# Patient Record
Sex: Female | Born: 1945 | Race: White | Hispanic: No | Marital: Married
Health system: Southern US, Community
[De-identification: ages and names within clinical notes are randomized; demographics above are authoritative.]

## PROBLEM LIST (undated history)

## (undated) DIAGNOSIS — D229 Melanocytic nevi, unspecified: Secondary | ICD-10-CM

---
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
## 1997-10-11 ENCOUNTER — Inpatient Hospital Stay (HOSPITAL_COMMUNITY): Admission: AD | Admit: 1997-10-11 | Discharge: 1997-10-14 | Payer: Self-pay | Admitting: Gastroenterology

## 1998-02-12 ENCOUNTER — Inpatient Hospital Stay (HOSPITAL_COMMUNITY): Admission: EM | Admit: 1998-02-12 | Discharge: 1998-02-15 | Payer: Self-pay | Admitting: Gastroenterology

## 1998-09-03 ENCOUNTER — Encounter (HOSPITAL_COMMUNITY): Admission: RE | Admit: 1998-09-03 | Discharge: 1998-12-02 | Payer: Self-pay | Admitting: Gastroenterology

## 1998-10-17 ENCOUNTER — Encounter: Admission: RE | Admit: 1998-10-17 | Discharge: 1999-01-15 | Payer: Self-pay | Admitting: Emergency Medicine

## 1998-10-18 ENCOUNTER — Ambulatory Visit (HOSPITAL_COMMUNITY): Admission: RE | Admit: 1998-10-18 | Discharge: 1998-10-18 | Payer: Self-pay | Admitting: Gastroenterology

## 1999-02-20 ENCOUNTER — Inpatient Hospital Stay (HOSPITAL_COMMUNITY): Admission: EM | Admit: 1999-02-20 | Discharge: 1999-02-23 | Payer: Self-pay | Admitting: Emergency Medicine

## 1999-02-20 ENCOUNTER — Encounter: Payer: Self-pay | Admitting: Emergency Medicine

## 1999-02-20 ENCOUNTER — Encounter: Payer: Self-pay | Admitting: Specialist

## 1999-04-30 ENCOUNTER — Other Ambulatory Visit: Admission: RE | Admit: 1999-04-30 | Discharge: 1999-04-30 | Payer: Self-pay | Admitting: *Deleted

## 1999-10-18 ENCOUNTER — Emergency Department (HOSPITAL_COMMUNITY): Admission: EM | Admit: 1999-10-18 | Discharge: 1999-10-18 | Payer: Self-pay | Admitting: Emergency Medicine

## 2000-05-12 ENCOUNTER — Encounter: Payer: Self-pay | Admitting: Specialist

## 2000-05-21 ENCOUNTER — Encounter: Payer: Self-pay | Admitting: Specialist

## 2000-05-21 ENCOUNTER — Inpatient Hospital Stay (HOSPITAL_COMMUNITY): Admission: RE | Admit: 2000-05-21 | Discharge: 2000-05-24 | Payer: Self-pay | Admitting: Specialist

## 2000-10-06 ENCOUNTER — Other Ambulatory Visit: Admission: RE | Admit: 2000-10-06 | Discharge: 2000-10-06 | Payer: Self-pay | Admitting: Obstetrics and Gynecology

## 2000-11-21 ENCOUNTER — Encounter: Payer: Self-pay | Admitting: Emergency Medicine

## 2000-11-21 ENCOUNTER — Emergency Department (HOSPITAL_COMMUNITY): Admission: EM | Admit: 2000-11-21 | Discharge: 2000-11-21 | Payer: Self-pay | Admitting: Family Medicine

## 2000-11-27 ENCOUNTER — Inpatient Hospital Stay (HOSPITAL_COMMUNITY): Admission: RE | Admit: 2000-11-27 | Discharge: 2000-11-29 | Payer: Self-pay | Admitting: Orthopedic Surgery

## 2000-11-27 ENCOUNTER — Encounter: Payer: Self-pay | Admitting: Orthopedic Surgery

## 2001-12-21 ENCOUNTER — Other Ambulatory Visit: Admission: RE | Admit: 2001-12-21 | Discharge: 2001-12-21 | Payer: Self-pay | Admitting: Obstetrics and Gynecology

## 2010-07-08 ENCOUNTER — Encounter
Admission: RE | Admit: 2010-07-08 | Discharge: 2010-07-08 | Payer: Self-pay | Source: Home / Self Care | Attending: Family Medicine | Admitting: Family Medicine

## 2011-10-05 IMAGING — CT CT HEAD W/O CM
2 series · 15 of 30 positions shown, 19 images · non-contrast
Comparison: None.

CLINICAL DATA: Headaches.  History of subarachnoid hemorrhage
status post treatment for aneurysm in 8779.

CT HEAD WITHOUT CONTRAST
TECHNIQUE: Contiguous axial images were obtained from the base of
the skull through the vertex without contrast.

[Series 2: head w/o · axial · non-contrast · 0.49mm/px · z∈[+5,+126]mm · 13 of 28 slices shown, 17 images]
[im 2/28  brain]
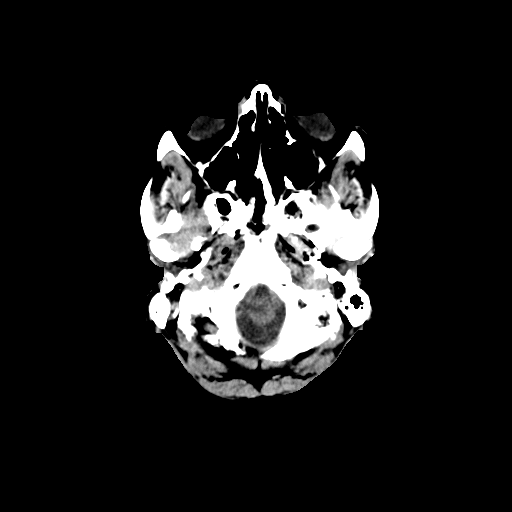
[im 2/28  bone]
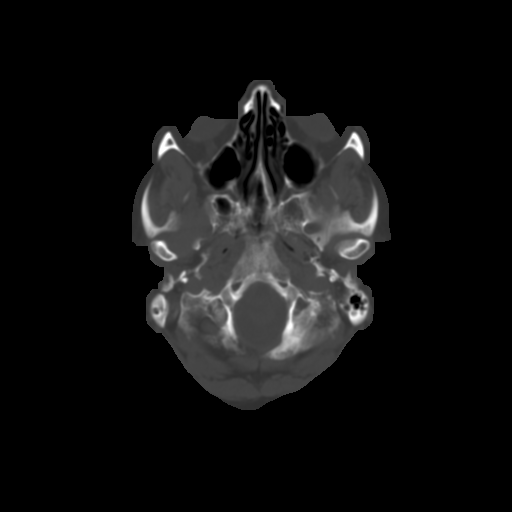
[im 4/28  brain]
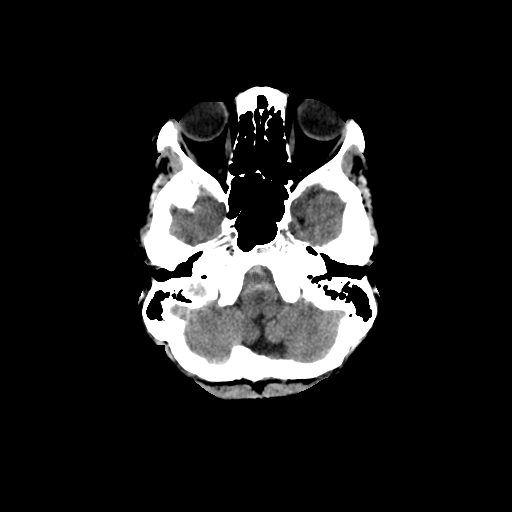
[im 6/28  brain]
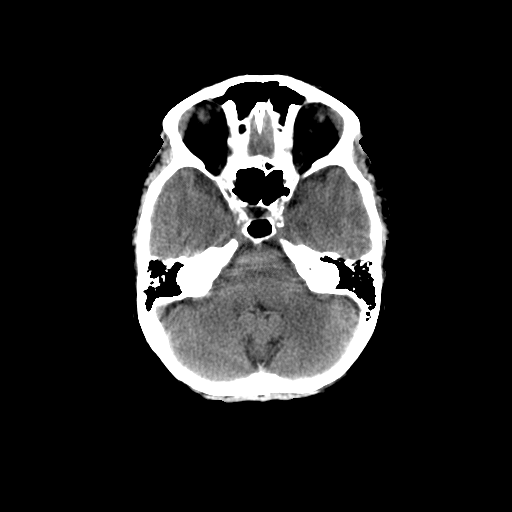
[im 8/28  brain]
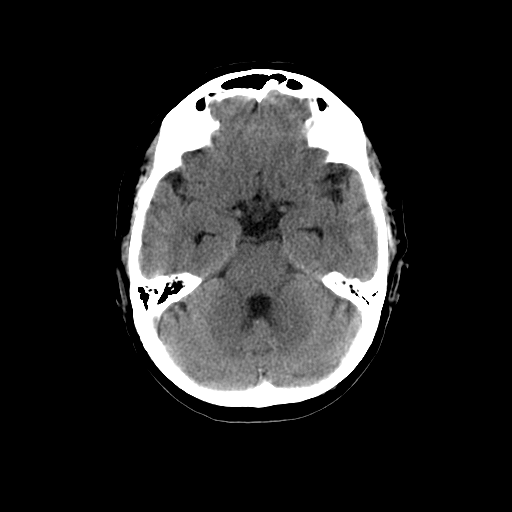
[im 10/28  brain]
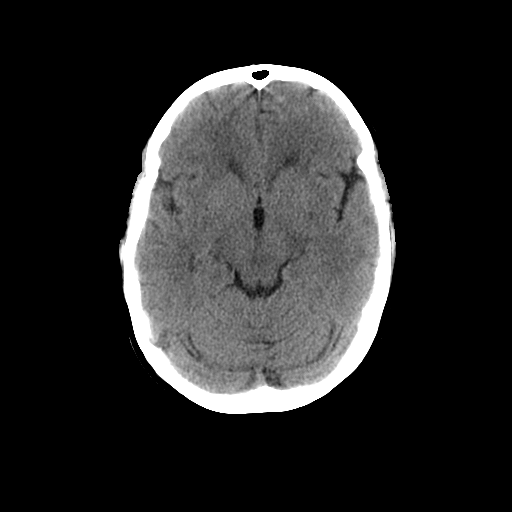
[im 10/28  bone]
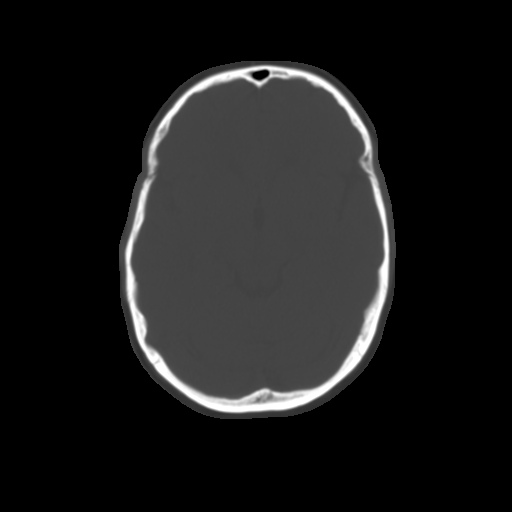
[im 12/28  brain]
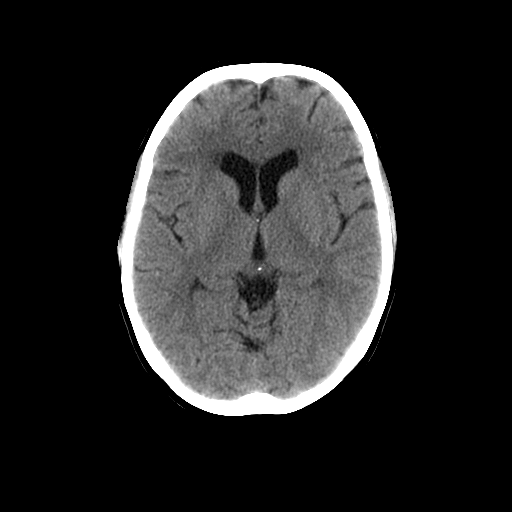
[im 14/28  brain]
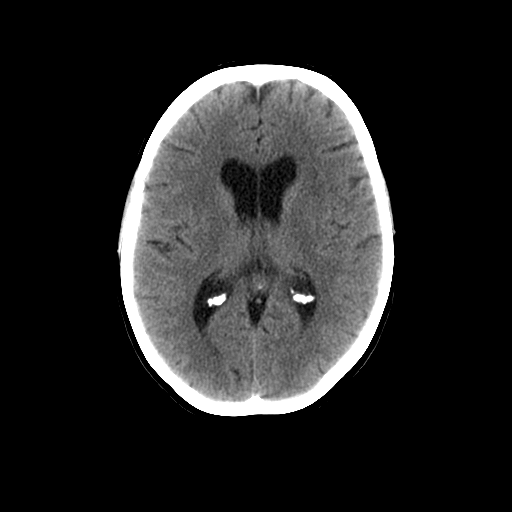
[im 16/28  brain]
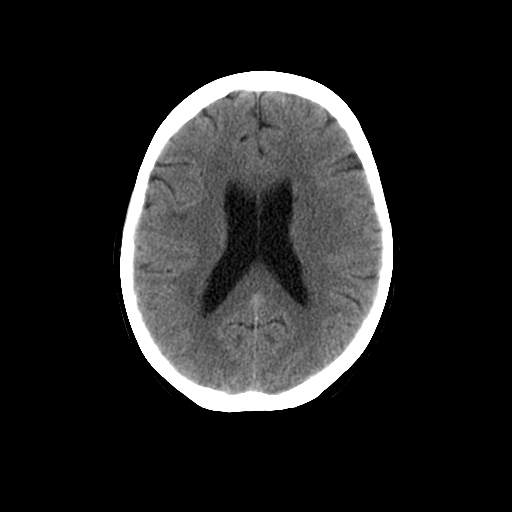
[im 18/28  brain]
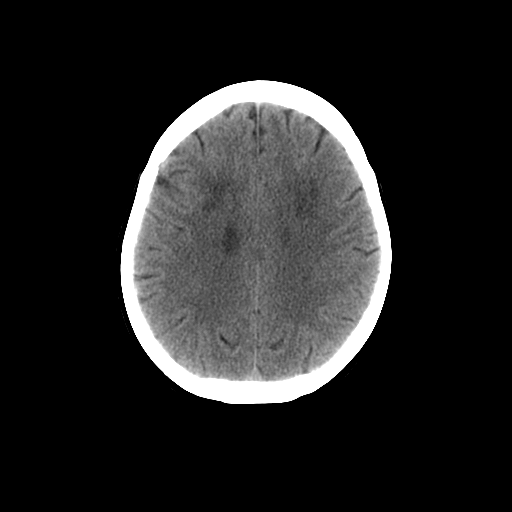
[im 18/28  bone]
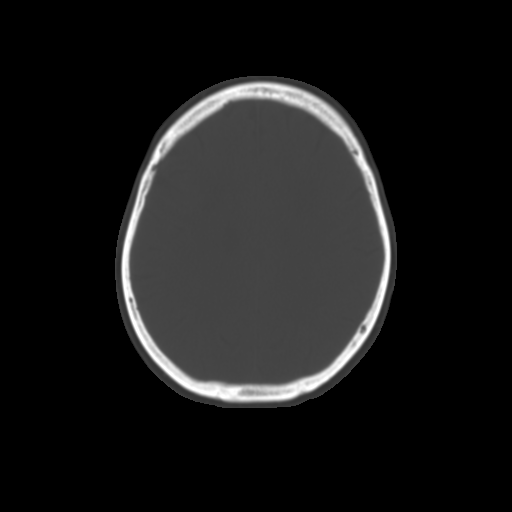
[im 20/28  brain]
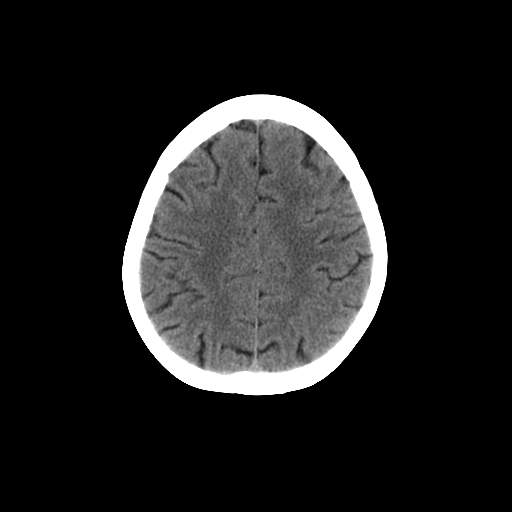
[im 22/28  brain]
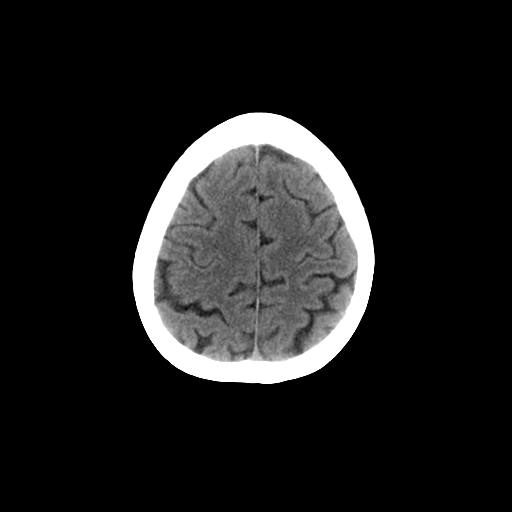
[im 24/28  brain]
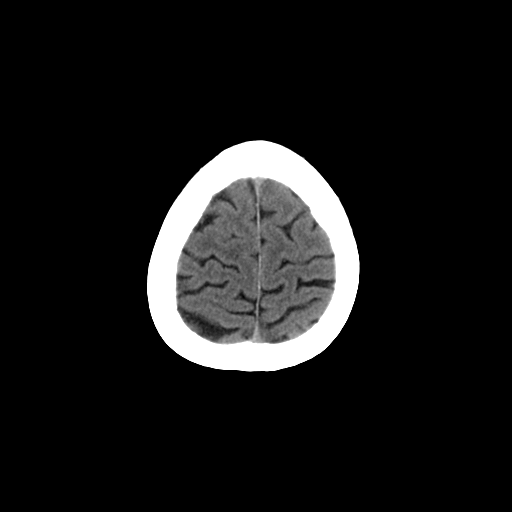
[im 26/28  brain]
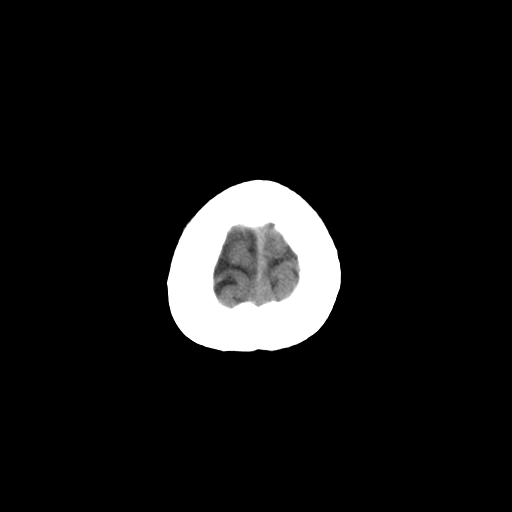
[im 26/28  bone]
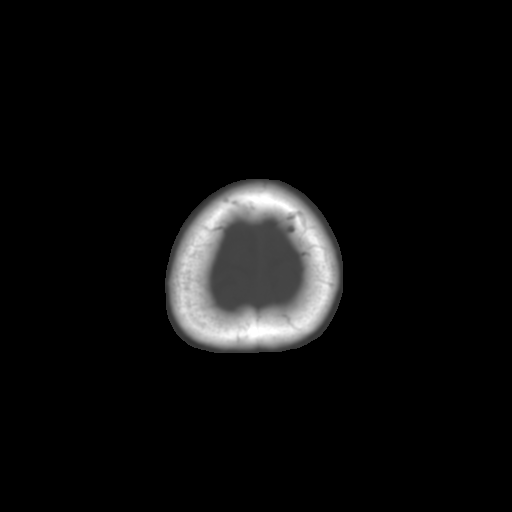

[Series 3: head bone · axial · 0.49mm/px · z∈[+5,+25]mm · 2 of 28 slices shown]
[im 2/28  bone]
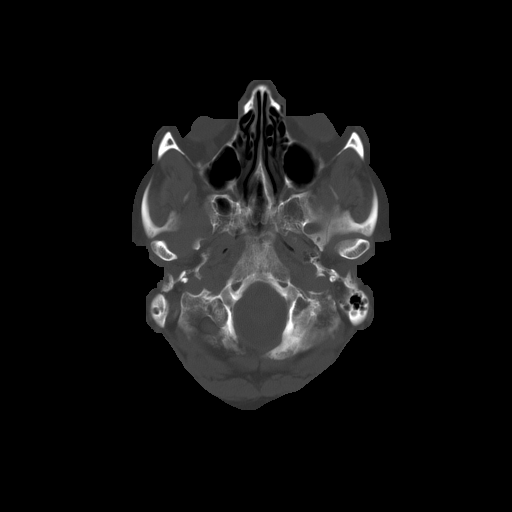
[im 6/28  bone]
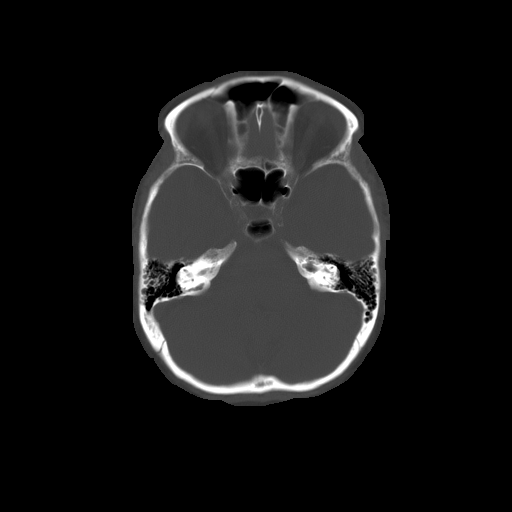

[15 of 30 positions shown; findings below may reference images not displayed]

FINDINGS: No acute cortical infarct, hemorrhage, mass lesion, or
hydrocephalus is present.  There is of subcortical white matter
hypoattenuation are clustered in the anterior frontal lobes
bilaterally.  No discrete mass lesion is evident.

The paranasal sinuses and mastoid air cells are clear.  The osseous
skull is intact.
IMPRESSION: 1.  Advanced subcortical white matter hypoattenuation in the
anterior frontal lobes bilaterally. The finding is nonspecific but
can be seen in the setting of chronic microvascular ischemia, a
demyelinating process such as multiple sclerosis, vasculitis,
complicated migraine headaches, or as the sequelae of a prior
infectious or inflammatory process.
2.  No acute intracranial abnormality.

## 2014-02-27 DIAGNOSIS — Z1231 Encounter for screening mammogram for malignant neoplasm of breast: Secondary | ICD-10-CM | POA: Diagnosis not present

## 2014-04-21 DIAGNOSIS — Z23 Encounter for immunization: Secondary | ICD-10-CM | POA: Diagnosis not present

## 2014-09-10 DIAGNOSIS — E559 Vitamin D deficiency, unspecified: Secondary | ICD-10-CM | POA: Diagnosis not present

## 2014-09-10 DIAGNOSIS — M810 Age-related osteoporosis without current pathological fracture: Secondary | ICD-10-CM | POA: Diagnosis not present

## 2014-09-10 DIAGNOSIS — K91850 Pouchitis: Secondary | ICD-10-CM | POA: Diagnosis not present

## 2014-09-10 DIAGNOSIS — F329 Major depressive disorder, single episode, unspecified: Secondary | ICD-10-CM | POA: Diagnosis not present

## 2014-09-10 DIAGNOSIS — I10 Essential (primary) hypertension: Secondary | ICD-10-CM | POA: Diagnosis not present

## 2014-09-10 DIAGNOSIS — Z0000 Encounter for general adult medical examination without abnormal findings: Secondary | ICD-10-CM | POA: Diagnosis not present

## 2014-09-10 DIAGNOSIS — Z79899 Other long term (current) drug therapy: Secondary | ICD-10-CM | POA: Diagnosis not present

## 2014-09-10 DIAGNOSIS — E785 Hyperlipidemia, unspecified: Secondary | ICD-10-CM | POA: Diagnosis not present

## 2014-09-27 DIAGNOSIS — M25522 Pain in left elbow: Secondary | ICD-10-CM | POA: Diagnosis not present

## 2014-10-17 DIAGNOSIS — M25522 Pain in left elbow: Secondary | ICD-10-CM | POA: Diagnosis not present

## 2015-04-13 DIAGNOSIS — Z1231 Encounter for screening mammogram for malignant neoplasm of breast: Secondary | ICD-10-CM | POA: Diagnosis not present

## 2015-06-10 DIAGNOSIS — S92302B Fracture of unspecified metatarsal bone(s), left foot, initial encounter for open fracture: Secondary | ICD-10-CM | POA: Diagnosis not present

## 2015-06-13 DIAGNOSIS — M25572 Pain in left ankle and joints of left foot: Secondary | ICD-10-CM | POA: Diagnosis not present

## 2015-06-13 DIAGNOSIS — S92352A Displaced fracture of fifth metatarsal bone, left foot, initial encounter for closed fracture: Secondary | ICD-10-CM | POA: Diagnosis not present

## 2015-06-13 DIAGNOSIS — S9032XA Contusion of left foot, initial encounter: Secondary | ICD-10-CM | POA: Diagnosis not present

## 2015-06-13 DIAGNOSIS — M7742 Metatarsalgia, left foot: Secondary | ICD-10-CM | POA: Diagnosis not present

## 2015-08-14 DIAGNOSIS — M25672 Stiffness of left ankle, not elsewhere classified: Secondary | ICD-10-CM | POA: Diagnosis not present

## 2015-08-14 DIAGNOSIS — M6702 Short Achilles tendon (acquired), left ankle: Secondary | ICD-10-CM | POA: Diagnosis not present

## 2015-08-14 DIAGNOSIS — M6281 Muscle weakness (generalized): Secondary | ICD-10-CM | POA: Diagnosis not present

## 2015-08-14 DIAGNOSIS — S92351D Displaced fracture of fifth metatarsal bone, right foot, subsequent encounter for fracture with routine healing: Secondary | ICD-10-CM | POA: Diagnosis not present

## 2015-08-23 DIAGNOSIS — M25672 Stiffness of left ankle, not elsewhere classified: Secondary | ICD-10-CM | POA: Diagnosis not present

## 2015-08-27 DIAGNOSIS — M25672 Stiffness of left ankle, not elsewhere classified: Secondary | ICD-10-CM | POA: Diagnosis not present

## 2015-08-29 DIAGNOSIS — M25672 Stiffness of left ankle, not elsewhere classified: Secondary | ICD-10-CM | POA: Diagnosis not present

## 2015-09-03 DIAGNOSIS — M25672 Stiffness of left ankle, not elsewhere classified: Secondary | ICD-10-CM | POA: Diagnosis not present

## 2015-09-11 DIAGNOSIS — M25572 Pain in left ankle and joints of left foot: Secondary | ICD-10-CM | POA: Diagnosis not present

## 2015-09-11 DIAGNOSIS — S92352D Displaced fracture of fifth metatarsal bone, left foot, subsequent encounter for fracture with routine healing: Secondary | ICD-10-CM | POA: Diagnosis not present

## 2015-09-11 DIAGNOSIS — M25872 Other specified joint disorders, left ankle and foot: Secondary | ICD-10-CM | POA: Diagnosis not present

## 2015-09-12 DIAGNOSIS — M810 Age-related osteoporosis without current pathological fracture: Secondary | ICD-10-CM | POA: Diagnosis not present

## 2015-09-12 DIAGNOSIS — E559 Vitamin D deficiency, unspecified: Secondary | ICD-10-CM | POA: Diagnosis not present

## 2015-09-12 DIAGNOSIS — Z79899 Other long term (current) drug therapy: Secondary | ICD-10-CM | POA: Diagnosis not present

## 2015-09-12 DIAGNOSIS — Z0001 Encounter for general adult medical examination with abnormal findings: Secondary | ICD-10-CM | POA: Diagnosis not present

## 2015-09-12 DIAGNOSIS — F329 Major depressive disorder, single episode, unspecified: Secondary | ICD-10-CM | POA: Diagnosis not present

## 2015-09-12 DIAGNOSIS — K91850 Pouchitis: Secondary | ICD-10-CM | POA: Diagnosis not present

## 2015-09-12 DIAGNOSIS — Z23 Encounter for immunization: Secondary | ICD-10-CM | POA: Diagnosis not present

## 2015-09-12 DIAGNOSIS — E7800 Pure hypercholesterolemia, unspecified: Secondary | ICD-10-CM | POA: Diagnosis not present

## 2015-09-12 DIAGNOSIS — I10 Essential (primary) hypertension: Secondary | ICD-10-CM | POA: Diagnosis not present

## 2015-10-04 DIAGNOSIS — M810 Age-related osteoporosis without current pathological fracture: Secondary | ICD-10-CM | POA: Diagnosis not present

## 2015-10-22 ENCOUNTER — Ambulatory Visit (INDEPENDENT_AMBULATORY_CARE_PROVIDER_SITE_OTHER): Payer: Medicare Other

## 2015-10-22 ENCOUNTER — Ambulatory Visit (INDEPENDENT_AMBULATORY_CARE_PROVIDER_SITE_OTHER): Payer: Medicare Other | Admitting: Podiatry

## 2015-10-22 ENCOUNTER — Encounter: Payer: Self-pay | Admitting: Podiatry

## 2015-10-22 VITALS — BP 129/79 | HR 89 | Resp 16

## 2015-10-22 DIAGNOSIS — M2010 Hallux valgus (acquired), unspecified foot: Secondary | ICD-10-CM | POA: Diagnosis not present

## 2015-10-22 DIAGNOSIS — M778 Other enthesopathies, not elsewhere classified: Secondary | ICD-10-CM

## 2015-10-22 DIAGNOSIS — M7752 Other enthesopathy of left foot: Secondary | ICD-10-CM | POA: Diagnosis not present

## 2015-10-22 DIAGNOSIS — M779 Enthesopathy, unspecified: Secondary | ICD-10-CM

## 2015-10-22 NOTE — Progress Notes (Signed)
   Subjective:    Patient ID: Donna Burton, female    DOB: [birth year], 70 y.o.   MRN: [id number]  HPI: She presents today concerned about her bunion deformities which have been bothersome for the past 15 years she states the left one has just become recently more painful summer so to the point she can hardly wear shoes. She states that it hurts down deep in the joint as well as on the bump area    Review of Systems  Musculoskeletal: Positive for gait problem.       Objective:   Physical Exam: Vital signs are stable alert and oriented 3 pulses are strongly palpable. Neurologic sensorium is intact. Deep tendon reflexes are intact. Muscle strength is normal bilateral. Orthopedic evaluation and stress rectus foot type bilateral. Mild hallux abductovalgus deformities with an elevated first metatarsal of the right foot resulting in a mild hallux limitation on dorsiflexion. Left foot demonstrates hallux abductovalgus deformity with pain on range of motion and on palpation of the first metatarsophalangeal joint. 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 Cutaneous evaluation and straight supple well-hydrated cutis no open lesions or wounds.        Assessment & Plan:  Assessment: Hallux abductovalgus deformity with capsulitis bilateral.  Plan: Discussed etiology pathology conservative versus surgical therapies. After sterile Betadine skin prep injected dexamethasone and local anesthetic into the first metatarsophalangeal joint of the left foot which she didn't most painful. She tolerated this procedure well and will follow up with her in 1 month necessary.

## 2016-04-02 DIAGNOSIS — Z23 Encounter for immunization: Secondary | ICD-10-CM | POA: Diagnosis not present

## 2016-07-30 DIAGNOSIS — Z1231 Encounter for screening mammogram for malignant neoplasm of breast: Secondary | ICD-10-CM | POA: Diagnosis not present

## 2016-08-06 DIAGNOSIS — D225 Melanocytic nevi of trunk: Secondary | ICD-10-CM | POA: Diagnosis not present

## 2016-08-06 DIAGNOSIS — D229 Melanocytic nevi, unspecified: Secondary | ICD-10-CM | POA: Diagnosis not present

## 2016-08-06 DIAGNOSIS — D485 Neoplasm of uncertain behavior of skin: Secondary | ICD-10-CM | POA: Diagnosis not present

## 2016-08-06 DIAGNOSIS — L821 Other seborrheic keratosis: Secondary | ICD-10-CM | POA: Diagnosis not present

## 2016-08-06 HISTORY — DX: Melanocytic nevi, unspecified: D22.9

## 2016-11-23 DIAGNOSIS — E669 Obesity, unspecified: Secondary | ICD-10-CM | POA: Diagnosis not present

## 2016-11-23 DIAGNOSIS — Z79899 Other long term (current) drug therapy: Secondary | ICD-10-CM | POA: Diagnosis not present

## 2016-11-23 DIAGNOSIS — M810 Age-related osteoporosis without current pathological fracture: Secondary | ICD-10-CM | POA: Diagnosis not present

## 2016-11-23 DIAGNOSIS — Z0000 Encounter for general adult medical examination without abnormal findings: Secondary | ICD-10-CM | POA: Diagnosis not present

## 2016-11-23 DIAGNOSIS — F329 Major depressive disorder, single episode, unspecified: Secondary | ICD-10-CM | POA: Diagnosis not present

## 2016-11-23 DIAGNOSIS — L089 Local infection of the skin and subcutaneous tissue, unspecified: Secondary | ICD-10-CM | POA: Diagnosis not present

## 2016-11-23 DIAGNOSIS — E7800 Pure hypercholesterolemia, unspecified: Secondary | ICD-10-CM | POA: Diagnosis not present

## 2016-11-23 DIAGNOSIS — K91850 Pouchitis: Secondary | ICD-10-CM | POA: Diagnosis not present

## 2016-11-23 DIAGNOSIS — E559 Vitamin D deficiency, unspecified: Secondary | ICD-10-CM | POA: Diagnosis not present

## 2016-11-23 DIAGNOSIS — I10 Essential (primary) hypertension: Secondary | ICD-10-CM | POA: Diagnosis not present

## 2017-04-21 DIAGNOSIS — M79672 Pain in left foot: Secondary | ICD-10-CM | POA: Diagnosis not present

## 2017-05-03 DIAGNOSIS — Z23 Encounter for immunization: Secondary | ICD-10-CM | POA: Diagnosis not present

## 2017-05-11 DIAGNOSIS — M79672 Pain in left foot: Secondary | ICD-10-CM | POA: Diagnosis not present

## 2017-06-04 DIAGNOSIS — M7742 Metatarsalgia, left foot: Secondary | ICD-10-CM | POA: Diagnosis not present

## 2017-11-22 DIAGNOSIS — Z1231 Encounter for screening mammogram for malignant neoplasm of breast: Secondary | ICD-10-CM | POA: Diagnosis not present

## 2017-11-24 DIAGNOSIS — N6011 Diffuse cystic mastopathy of right breast: Secondary | ICD-10-CM | POA: Diagnosis not present

## 2017-11-29 DIAGNOSIS — M810 Age-related osteoporosis without current pathological fracture: Secondary | ICD-10-CM | POA: Diagnosis not present

## 2017-11-29 DIAGNOSIS — I10 Essential (primary) hypertension: Secondary | ICD-10-CM | POA: Diagnosis not present

## 2017-11-29 DIAGNOSIS — F321 Major depressive disorder, single episode, moderate: Secondary | ICD-10-CM | POA: Diagnosis not present

## 2017-11-29 DIAGNOSIS — E7800 Pure hypercholesterolemia, unspecified: Secondary | ICD-10-CM | POA: Diagnosis not present

## 2017-11-29 DIAGNOSIS — Z0001 Encounter for general adult medical examination with abnormal findings: Secondary | ICD-10-CM | POA: Diagnosis not present

## 2017-11-29 DIAGNOSIS — Z1159 Encounter for screening for other viral diseases: Secondary | ICD-10-CM | POA: Diagnosis not present

## 2017-11-29 DIAGNOSIS — Z79899 Other long term (current) drug therapy: Secondary | ICD-10-CM | POA: Diagnosis not present

## 2017-12-01 DIAGNOSIS — M25522 Pain in left elbow: Secondary | ICD-10-CM | POA: Diagnosis not present

## 2017-12-13 DIAGNOSIS — M810 Age-related osteoporosis without current pathological fracture: Secondary | ICD-10-CM | POA: Diagnosis not present

## 2017-12-13 DIAGNOSIS — M8589 Other specified disorders of bone density and structure, multiple sites: Secondary | ICD-10-CM | POA: Diagnosis not present

## 2018-03-16 DIAGNOSIS — Z23 Encounter for immunization: Secondary | ICD-10-CM | POA: Diagnosis not present

## 2018-06-27 DIAGNOSIS — J320 Chronic maxillary sinusitis: Secondary | ICD-10-CM | POA: Diagnosis not present

## 2018-06-27 DIAGNOSIS — J069 Acute upper respiratory infection, unspecified: Secondary | ICD-10-CM | POA: Diagnosis not present

## 2018-12-07 DIAGNOSIS — E559 Vitamin D deficiency, unspecified: Secondary | ICD-10-CM | POA: Diagnosis not present

## 2018-12-07 DIAGNOSIS — I10 Essential (primary) hypertension: Secondary | ICD-10-CM | POA: Diagnosis not present

## 2018-12-07 DIAGNOSIS — M810 Age-related osteoporosis without current pathological fracture: Secondary | ICD-10-CM | POA: Diagnosis not present

## 2018-12-07 DIAGNOSIS — F321 Major depressive disorder, single episode, moderate: Secondary | ICD-10-CM | POA: Diagnosis not present

## 2018-12-07 DIAGNOSIS — Z79899 Other long term (current) drug therapy: Secondary | ICD-10-CM | POA: Diagnosis not present

## 2018-12-07 DIAGNOSIS — E7800 Pure hypercholesterolemia, unspecified: Secondary | ICD-10-CM | POA: Diagnosis not present

## 2018-12-07 DIAGNOSIS — Z0001 Encounter for general adult medical examination with abnormal findings: Secondary | ICD-10-CM | POA: Diagnosis not present

## 2019-05-17 DIAGNOSIS — Z23 Encounter for immunization: Secondary | ICD-10-CM | POA: Diagnosis not present

## 2019-08-29 DIAGNOSIS — Z6834 Body mass index (BMI) 34.0-34.9, adult: Secondary | ICD-10-CM | POA: Diagnosis not present

## 2019-08-29 DIAGNOSIS — R221 Localized swelling, mass and lump, neck: Secondary | ICD-10-CM | POA: Diagnosis not present
